# Patient Record
Sex: Female | Born: 2000 | Race: Black or African American | Hispanic: No | Marital: Single | Smoking: Never smoker
Health system: Southern US, Community
[De-identification: ages and names within clinical notes are randomized; demographics above are authoritative.]

## PROBLEM LIST (undated history)

## (undated) DIAGNOSIS — E119 Type 2 diabetes mellitus without complications: Secondary | ICD-10-CM

## (undated) HISTORY — PX: NO PAST SURGERIES: SHX2092

---
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
## 2019-01-19 DIAGNOSIS — E1169 Type 2 diabetes mellitus with other specified complication: Secondary | ICD-10-CM | POA: Insufficient documentation

## 2019-01-19 DIAGNOSIS — E119 Type 2 diabetes mellitus without complications: Secondary | ICD-10-CM | POA: Insufficient documentation

## 2021-02-03 ENCOUNTER — Ambulatory Visit (HOSPITAL_COMMUNITY)
Admission: EM | Admit: 2021-02-03 | Discharge: 2021-02-03 | Disposition: A | Discharge status: Home / Self Care | Payer: 59

## 2021-02-03 ENCOUNTER — Other Ambulatory Visit: Payer: Self-pay

## 2021-02-03 ENCOUNTER — Ambulatory Visit (INDEPENDENT_AMBULATORY_CARE_PROVIDER_SITE_OTHER): Payer: 59

## 2021-02-03 ENCOUNTER — Encounter (HOSPITAL_COMMUNITY): Payer: Self-pay | Admitting: Emergency Medicine

## 2021-02-03 DIAGNOSIS — M7918 Myalgia, other site: Secondary | ICD-10-CM | POA: Diagnosis not present

## 2021-02-03 DIAGNOSIS — M25511 Pain in right shoulder: Secondary | ICD-10-CM

## 2021-02-03 DIAGNOSIS — M25512 Pain in left shoulder: Secondary | ICD-10-CM

## 2021-02-03 HISTORY — DX: Type 2 diabetes mellitus without complications: E11.9

## 2021-02-03 MED ORDER — BACLOFEN 10 MG PO TABS: 10.0000 mg | ORAL_TABLET | Freq: Every day | ORAL | 0 refills | Status: AC

## 2021-02-03 MED ORDER — IBUPROFEN 800 MG PO TABS: 800.0000 mg | ORAL_TABLET | Freq: Three times a day (TID) | ORAL | 0 refills | Status: AC | PRN
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
## 2022-03-29 ENCOUNTER — Ambulatory Visit: Payer: Commercial Managed Care - PPO | Admitting: Family

## 2022-06-17 ENCOUNTER — Encounter: Payer: Self-pay | Admitting: Family

## 2022-06-17 ENCOUNTER — Ambulatory Visit: Payer: Commercial Managed Care - PPO | Admitting: Family

## 2022-06-17 VITALS — BP 130/82 | HR 99 | Temp 97.7°F | Ht 66.25 in | Wt 270.0 lb

## 2022-06-17 DIAGNOSIS — Z7984 Long term (current) use of oral hypoglycemic drugs: Secondary | ICD-10-CM

## 2022-06-17 DIAGNOSIS — E119 Type 2 diabetes mellitus without complications: Secondary | ICD-10-CM | POA: Diagnosis not present

## 2022-06-17 DIAGNOSIS — Z1159 Encounter for screening for other viral diseases: Secondary | ICD-10-CM | POA: Diagnosis not present

## 2022-06-17 DIAGNOSIS — R6889 Other general symptoms and signs: Secondary | ICD-10-CM | POA: Diagnosis not present

## 2022-06-17 DIAGNOSIS — Z23 Encounter for immunization: Secondary | ICD-10-CM | POA: Diagnosis not present

## 2022-06-17 DIAGNOSIS — Z114 Encounter for screening for human immunodeficiency virus [HIV]: Secondary | ICD-10-CM

## 2022-06-17 DIAGNOSIS — E785 Hyperlipidemia, unspecified: Secondary | ICD-10-CM

## 2022-06-17 DIAGNOSIS — R7989 Other specified abnormal findings of blood chemistry: Secondary | ICD-10-CM | POA: Diagnosis not present

## 2022-06-17 DIAGNOSIS — E1169 Type 2 diabetes mellitus with other specified complication: Secondary | ICD-10-CM

## 2022-06-17 LAB — BASIC METABOLIC PANEL
BUN: 10 mg/dL (ref 6–23)
CO2: 24 mEq/L (ref 19–32)
Calcium: 9.3 mg/dL (ref 8.4–10.5)
Chloride: 103 mEq/L (ref 96–112)
Creatinine, Ser: 0.61 mg/dL (ref 0.40–1.20)
GFR: 126.89 mL/min (ref >60.00)
Glucose, Bld: 147 mg/dL — ABNORMAL HIGH (ref 70–99)
Potassium: 4.3 mEq/L (ref 3.5–5.1)
Sodium: 137 mEq/L (ref 135–145)

## 2022-06-17 LAB — CBC
HCT: 37 % (ref 36.0–46.0)
Hemoglobin: 11.9 g/dL — ABNORMAL LOW (ref 12.0–15.0)
MCHC: 32 g/dL (ref 30.0–36.0)
MCV: 77.1 fl — ABNORMAL LOW (ref 78.0–100.0)
Platelets: 439 10*3/uL — ABNORMAL HIGH (ref 150.0–400.0)
RBC: 4.8 Mil/uL (ref 3.87–5.11)
RDW: 14.5 % (ref 11.5–15.5)
WBC: 8.6 10*3/uL (ref 4.0–10.5)

## 2022-06-17 LAB — IBC + FERRITIN
Ferritin: 22.8 ng/mL (ref 10.0–291.0)
Iron: 40 ug/dL — ABNORMAL LOW (ref 42–145)
Saturation Ratios: 11.8 % — ABNORMAL LOW (ref 20.0–50.0)
TIBC: 340.2 ug/dL (ref 250.0–450.0)
Transferrin: 243 mg/dL (ref 212.0–360.0)

## 2022-06-17 LAB — MICROALBUMIN / CREATININE URINE RATIO
Creatinine,U: 161.2 mg/dL
Microalb Creat Ratio: 3 mg/g (ref 0.0–30.0)
Microalb, Ur: 4.9 mg/dL — ABNORMAL HIGH (ref 0.0–1.9)

## 2022-06-17 LAB — T4, FREE: Free T4: 0.86 ng/dL (ref 0.60–1.60)

## 2022-06-17 LAB — T3, FREE: T3, Free: 3.8 pg/mL (ref 2.3–4.2)

## 2022-06-17 LAB — TSH: TSH: 6.84 u[IU]/mL — ABNORMAL HIGH (ref 0.35–5.50)

## 2022-06-17 NOTE — Assessment & Plan Note (Signed)
Cold intolerance  Will assess cbc ibc ferritin pending results Also will repeat tsh with thyroid panel pending results

## 2022-06-17 NOTE — Assessment & Plan Note (Signed)
Pt interested in phentermine but will await labs and let me know if she would like to pursue Did offer referral to weight loss clinic, she declines for today may consider in the future.  Offered ideas on diet/exercise as well.

## 2022-06-17 NOTE — Assessment & Plan Note (Signed)
Work on low cholesterol diet and exercise as tolerated 

## 2022-06-17 NOTE — Addendum Note (Signed)
Addended by: Gabriel Rainwater on: 06/17/2022 12:42 PM   Modules accepted: Orders

## 2022-06-17 NOTE — Progress Notes (Signed)
New Patient Office Visit  Subjective:  Patient ID: Colleen Suarez, female    DOB: 2000-11-02  Age: 22 y.o. MRN: [id number]  CC:  Chief Complaint  Patient presents with   Establish Care    HPI Trinell Weidner is here to establish care as a new patient.  Oriented to practice routines and expectations.  Prior provider was: has not been since seeing a pediatrician   Menses: regular, monthly not overly heavy. Uses pads   Pt is with acute concerns.  Wants her iron checked because at times her hands and feet get cold. She does state she is often cold especially when cold around her.   Weight concerns:  Diet: mom cooks and or she cooks at home. Only occasionally will go out to eat. Occasional fried food at home, but tries to bake at times and or use air fryer. Eats a good amount of rice and soups. Soups are more brothy. Bread sometimes. Does eat a good amount of spinach and broccoli. Only eats about once a day.  Breakfast: will drink a smoothie at breakfast Lunch: usually skips Dinner: meals listed above   Exercise: trying to walk more , in the past did a diet program in the gym which seemed to help her but then had a plateau, once school started back again she steered around from the routine due to college keeping her busy. Will try to walk for 30 minutes daily   chronic concerns:  Type 2 DM: average fasting between 120-150. Diagnosis of diabetes more recent, probably around 2021. Does see an endocrinology. Does chest her blood glucose regularly. Last A1c 01/10/22 6.7. has f/u with her every six months.   Hyperlipidemia: last lipid panel, LDL was 111 01/10/22.   Thyroid level was a bit elevated 01/10/22 at 6.962. she denies increased fatigue. Not gaining weight in a short period of time. No thinning hair.  Wt Readings from Last 3 Encounters:  06/17/22 270 lb (122.5 kg)   B12 def: last b12 was 211 12/23 , is not taking daily b12 supplementation      ROS: Negative unless  specifically indicated above in HPI.   Current Outpatient Medications:    Blood Glucose Monitoring Suppl (ONETOUCH VERIO) w/Device KIT, Use to monitor blood sugar, Disp: , Rfl:    Lancets (ONETOUCH DELICA PLUS LANCET33G) MISC, Apply 1 each topically 2 (two) times daily., Disp: , Rfl:    metFORMIN (GLUCOPHAGE-XR) 500 MG 24 hr tablet, Take 1,000 mg by mouth 2 (two) times daily., Disp: , Rfl:    ONETOUCH VERIO test strip, USE TO TEST TWICE DAILY, Disp: , Rfl:  Past Medical History:  Diagnosis Date   Diabetes mellitus without complication (HCC)    Past Surgical History:  Procedure Laterality Date   NO PAST SURGERIES      Objective:   Today's Vitals: BP 130/82   Pulse 99   Temp 97.7 F (36.5 C) (Temporal)   Ht 5' 6.25" (1.683 m)   Wt 270 lb (122.5 kg)   LMP 05/25/2022   SpO2 100%   BMI 43.25 kg/m   Physical Exam Constitutional:      General: She is not in acute distress.    Appearance: Normal appearance. She is obese. She is not ill-appearing.  HENT:     Head: Normocephalic.     Right Ear: Tympanic membrane normal.     Left Ear: Tympanic membrane normal.     Nose: Nose normal.     Mouth/Throat:  Mouth: Mucous membranes are moist.  Eyes:     Extraocular Movements: Extraocular movements intact.     Pupils: Pupils are equal, round, and reactive to light.  Neck:     Thyroid: No thyroid mass, thyromegaly or thyroid tenderness.  Cardiovascular:     Rate and Rhythm: Normal rate and regular rhythm.  Pulmonary:     Effort: Pulmonary effort is normal.     Breath sounds: Normal breath sounds.  Abdominal:     General: Abdomen is flat. Bowel sounds are normal.     Palpations: Abdomen is soft.     Tenderness: There is no guarding or rebound.  Musculoskeletal:        General: Normal range of motion.     Cervical back: Normal range of motion.  Skin:    General: Skin is warm.     Capillary Refill: Capillary refill takes less than 2 seconds.  Neurological:     General: No  focal deficit present.     Mental Status: She is alert.  Psychiatric:        Mood and Affect: Mood normal.        Behavior: Behavior normal.        Thought Content: Thought content normal.        Judgment: Judgment normal.     Assessment & Plan:  Abnormal TSH Assessment & Plan: Cold intolerance  Will assess cbc ibc ferritin pending results Also will repeat tsh with thyroid panel pending results   Orders: -     Thyroid Panel With TSH  Cold intolerance -     IBC + Ferritin -     CBC  Screening for HIV (human immunodeficiency virus) -     HIV Antibody (routine testing w rflx)  Type 2 diabetes mellitus without complication, without long-term current use of insulin (HCC) Assessment & Plan: Continue metfromin as prescribed f/u with endo as scheduled Did d/w pt that she could consider GLP 1 for weight loss and could d/w with her endo however she declines at this time.  Foot exam completed last visit with endo per pt.  Will obtain urine M/A today  Orders: -     Microalbumin / creatinine urine ratio -     Basic metabolic panel  Encounter for hepatitis C screening test for low risk patient -     Hepatitis C antibody  Morbid obesity (HCC) Assessment & Plan: Pt interested in phentermine but will await labs and let me know if she would like to pursue Did offer referral to weight loss clinic, she declines for today may consider in the future.  Offered ideas on diet/exercise as well.     Hyperlipidemia associated with type 2 diabetes mellitus (HCC) Assessment & Plan: Work on low cholesterol diet and exercise as tolerated      Follow-up: Return in about 1 year (around 06/17/2023) for f/u CPE.   Mort Sawyers, FNP

## 2022-06-17 NOTE — Assessment & Plan Note (Signed)
Continue metfromin as prescribed f/u with endo as scheduled Did d/w pt that she could consider GLP 1 for weight loss and could d/w with her endo however she declines at this time.  Foot exam completed last visit with endo per pt.  Will obtain urine M/A today

## 2022-06-18 ENCOUNTER — Encounter: Payer: Self-pay | Admitting: Family

## 2022-06-18 ENCOUNTER — Other Ambulatory Visit: Payer: Self-pay | Admitting: Family

## 2022-06-18 DIAGNOSIS — E039 Hypothyroidism, unspecified: Secondary | ICD-10-CM

## 2022-06-18 DIAGNOSIS — R809 Proteinuria, unspecified: Secondary | ICD-10-CM

## 2022-06-18 LAB — HIV ANTIBODY (ROUTINE TESTING W REFLEX): HIV 1&2 Ab, 4th Generation: NONREACTIVE

## 2022-06-18 LAB — HEPATITIS C ANTIBODY: Hepatitis C Ab: NONREACTIVE

## 2022-06-18 MED ORDER — LOSARTAN POTASSIUM 25 MG PO TABS: 25.0000 mg | ORAL_TABLET | Freq: Every day | ORAL | 3 refills | Status: DC

## 2022-06-18 MED ORDER — LEVOTHYROXINE SODIUM 50 MCG PO TABS: 50.0000 ug | ORAL_TABLET | Freq: Every day | ORAL | 3 refills | Status: DC

## 2022-06-20 ENCOUNTER — Encounter: Payer: Self-pay | Admitting: Family

## 2022-06-20 NOTE — Telephone Encounter (Signed)
Please see the MyChart message reply(ies) for my assessment and plan.  The patient gave consent for this Medical Advice Message and is aware that it may result in a bill to their insurance company as well as the possibility that this may result in a co-payment or deductible. They are an established patient, but are not seeking medical advice exclusively about a problem treated during an in person or video visit in the last 7 days. I did not recommend an in person or video visit within 7 days of my reply.  I spent a total of 7 minutes cumulative time within 7 days through [employer] Mort Sawyers, FNP

## 2022-06-20 NOTE — Telephone Encounter (Signed)

## 2022-06-20 NOTE — Telephone Encounter (Signed)
Not a problem! I will respond to this once I am done seeing patients. I always appreciate someone reaching out to ask questions it doesn't help if you don't ask :)

## 2022-06-21 NOTE — Telephone Encounter (Signed)
Please see the MyChart message reply(ies) for my assessment and plan.  The patient gave consent for this Medical Advice Message and is aware that it may result in a bill to their insurance company as well as the possibility that this may result in a co-payment or deductible. They are an established patient, but are not seeking medical advice exclusively about a problem treated during an in person or video visit in the last 7 days. I did not recommend an in person or video visit within 7 days of my reply.  I spent a total of 10 minutes cumulative time within 7 days through [employer] Mort Sawyers, FNP

## 2022-07-29 ENCOUNTER — Other Ambulatory Visit (INDEPENDENT_AMBULATORY_CARE_PROVIDER_SITE_OTHER): Payer: Commercial Managed Care - PPO

## 2022-07-29 DIAGNOSIS — E039 Hypothyroidism, unspecified: Secondary | ICD-10-CM | POA: Diagnosis not present

## 2022-07-30 LAB — TSH: TSH: 2.8 u[IU]/mL (ref 0.35–5.50)

## 2022-07-31 ENCOUNTER — Encounter: Payer: Self-pay | Admitting: Family

## 2022-08-05 ENCOUNTER — Ambulatory Visit: Payer: Commercial Managed Care - PPO | Admitting: Family

## 2023-02-24 ENCOUNTER — Ambulatory Visit: Payer: Commercial Managed Care - PPO | Admitting: Family

## 2023-03-03 ENCOUNTER — Encounter: Payer: Commercial Managed Care - PPO | Admitting: Family

## 2023-03-04 ENCOUNTER — Ambulatory Visit (INDEPENDENT_AMBULATORY_CARE_PROVIDER_SITE_OTHER): Payer: Commercial Managed Care - PPO | Admitting: Family

## 2023-03-04 ENCOUNTER — Encounter: Payer: Self-pay | Admitting: Family

## 2023-03-04 VITALS — BP 126/84 | HR 115 | Temp 97.8°F | Ht 66.5 in | Wt 262.0 lb

## 2023-03-04 DIAGNOSIS — E119 Type 2 diabetes mellitus without complications: Secondary | ICD-10-CM

## 2023-03-04 DIAGNOSIS — E039 Hypothyroidism, unspecified: Secondary | ICD-10-CM | POA: Diagnosis not present

## 2023-03-04 DIAGNOSIS — E785 Hyperlipidemia, unspecified: Secondary | ICD-10-CM

## 2023-03-04 DIAGNOSIS — E1169 Type 2 diabetes mellitus with other specified complication: Secondary | ICD-10-CM | POA: Diagnosis not present

## 2023-03-04 DIAGNOSIS — E049 Nontoxic goiter, unspecified: Secondary | ICD-10-CM

## 2023-03-04 DIAGNOSIS — R7989 Other specified abnormal findings of blood chemistry: Secondary | ICD-10-CM

## 2023-03-04 DIAGNOSIS — Z23 Encounter for immunization: Secondary | ICD-10-CM

## 2023-03-04 DIAGNOSIS — Z862 Personal history of diseases of the blood and blood-forming organs and certain disorders involving the immune mechanism: Secondary | ICD-10-CM

## 2023-03-04 DIAGNOSIS — Z7984 Long term (current) use of oral hypoglycemic drugs: Secondary | ICD-10-CM

## 2023-03-04 DIAGNOSIS — F411 Generalized anxiety disorder: Secondary | ICD-10-CM

## 2023-03-04 LAB — IBC + FERRITIN
Ferritin: 32.9 ng/mL (ref 10.0–291.0)
Iron: 69 ug/dL (ref 42–145)
Saturation Ratios: 22.1 % (ref 20.0–50.0)
TIBC: 312.2 ug/dL (ref 250.0–450.0)
Transferrin: 223 mg/dL (ref 212.0–360.0)

## 2023-03-04 LAB — CBC
HCT: 39 % (ref 36.0–46.0)
Hemoglobin: 12.6 g/dL (ref 12.0–15.0)
MCHC: 32.4 g/dL (ref 30.0–36.0)
MCV: 78.4 fL (ref 78.0–100.0)
Platelets: 445 10*3/uL — ABNORMAL HIGH (ref 150.0–400.0)
RBC: 4.97 Mil/uL (ref 3.87–5.11)
RDW: 14.9 % (ref 11.5–15.5)
WBC: 6.9 10*3/uL (ref 4.0–10.5)

## 2023-03-04 LAB — BASIC METABOLIC PANEL
BUN: 6 mg/dL (ref 6–23)
CO2: 26 meq/L (ref 19–32)
Calcium: 9.4 mg/dL (ref 8.4–10.5)
Chloride: 104 meq/L (ref 96–112)
Creatinine, Ser: 0.6 mg/dL (ref 0.40–1.20)
GFR: 126.76 mL/min (ref >60.00)
Glucose, Bld: 122 mg/dL — ABNORMAL HIGH (ref 70–99)
Potassium: 4.2 meq/L (ref 3.5–5.1)
Sodium: 140 meq/L (ref 135–145)

## 2023-03-04 LAB — LIPID PANEL
Cholesterol: 162 mg/dL (ref 0–200)
HDL: 35.9 mg/dL — ABNORMAL LOW (ref >39.00)
LDL Cholesterol: 106 mg/dL — ABNORMAL HIGH (ref 0–99)
NonHDL: 126.31
Total CHOL/HDL Ratio: 5
Triglycerides: 102 mg/dL (ref 0.0–149.0)
VLDL: 20.4 mg/dL (ref 0.0–40.0)

## 2023-03-04 LAB — TSH: TSH: 2.98 u[IU]/mL (ref 0.35–5.50)

## 2023-03-04 MED ORDER — HYDROXYZINE PAMOATE 25 MG PO CAPS: 25.0000 mg | ORAL_CAPSULE | Freq: Three times a day (TID) | ORAL | 0 refills | Status: AC | PRN

## 2023-03-04 NOTE — Assessment & Plan Note (Signed)
Cont f/u with endo as scheduled  Cont metformin 1000 mg twice daily.

## 2023-03-04 NOTE — Assessment & Plan Note (Signed)
Tsh repeat today  U/s thyroid pt to schedule

## 2023-03-04 NOTE — Progress Notes (Signed)
Subjective:  Patient ID: Colleen Suarez, female    DOB: 02-28-2000  Age: 23 y.o. MRN: [id number]  Patient Care Team: Mort Sawyers, FNP as PCP - General (Family Medicine)   CC:  Chief Complaint  Patient presents with   Annual Exam    HPI Colleen Suarez is a 23 y.o. female who presents today for an annual physical exam. She reports consuming a general diet. The patient does not participate in regular exercise at present. She generally feels fairly well. She reports sleeping fairly well. She does have additional problems to discuss today.   Vision:Within last year Dental:Receives regular dental care  Last pap:   Pt is without acute concerns.   Dr. Karrie Doffing is prescribing phentermine, currently on 37.5 mg she is seeing endocrinologist for obesity.   Notices a spot on the right side of her throat, midline to lateral right side. Not tender. Noticed it about one month ago. Has not increased in size. No trouble breathing or swallowing.   Diabetes type 2: on metformin 1000 mg twice daily.   Hypothyroid: doing well on 50 mcg levothyroxine. Does take mvi and iron separate four hours from med. No increased fatigue but at times slightly fatigued but takes a nap and feels energized.   Wt Readings from Last 3 Encounters:  03/04/23 262 lb (118.8 kg)  06/17/22 270 lb (122.5 kg)   C/o anxiety state at times, worse at night time. She has racing thoughts at times hard to turn brain off to go to sleep. Not daily. She does work on [employer].    Advanced Directives Patient does not have advanced directives   DEPRESSION SCREENING    06/17/2022   12:42 PM  PHQ 2/9 Scores  PHQ - 2 Score 1     ROS: Negative unless specifically indicated above in HPI.    Current Outpatient Medications:    Blood Glucose Monitoring Suppl (ONETOUCH VERIO) w/Device KIT, Use to monitor blood sugar, Disp: , Rfl:    hydrOXYzine (VISTARIL) 25 MG capsule, Take 1 capsule (25 mg total) by mouth every 8 (eight) hours  as needed., Disp: 30 capsule, Rfl: 0   Lancets (ONETOUCH DELICA PLUS LANCET33G) MISC, Apply 1 each topically 2 (two) times daily., Disp: , Rfl:    levothyroxine (SYNTHROID) 50 MCG tablet, Take 1 tablet (50 mcg total) by mouth daily., Disp: 90 tablet, Rfl: 3   losartan (COZAAR) 25 MG tablet, Take 1 tablet (25 mg total) by mouth daily., Disp: 90 tablet, Rfl: 3   metFORMIN (GLUCOPHAGE-XR) 500 MG 24 hr tablet, Take 1,000 mg by mouth 2 (two) times daily., Disp: , Rfl:    ONETOUCH VERIO test strip, USE TO TEST TWICE DAILY, Disp: , Rfl:    phentermine 37.5 MG capsule, Take 37.5 mg by mouth every morning., Disp: , Rfl:     Objective:    BP 126/84 (BP Location: Left Arm, Patient Position: Sitting, Cuff Size: Large)   Pulse (!) 115   Temp 97.8 F (36.6 C) (Temporal)   Ht 5' 6.5" (1.689 m)   Wt 262 lb (118.8 kg)   LMP 03/02/2023 (Exact Date)   SpO2 98%   BMI 41.65 kg/m   BP Readings from Last 3 Encounters:  03/04/23 126/84  06/17/22 130/82  02/03/21 (!) 133/91      Physical Exam Vitals reviewed.  Constitutional:      General: She is not in acute distress.    Appearance: Normal appearance. She is obese. She is not ill-appearing, toxic-appearing or  diaphoretic.  HENT:     Head: Normocephalic.  Neck:     Thyroid: Thyromegaly (right sided) present. No thyroid mass or thyroid tenderness.  Cardiovascular:     Rate and Rhythm: Normal rate and regular rhythm.  Pulmonary:     Effort: Pulmonary effort is normal.     Breath sounds: Normal breath sounds.  Musculoskeletal:        General: Normal range of motion.  Neurological:     General: No focal deficit present.     Mental Status: She is alert and oriented to person, place, and time. Mental status is at baseline.  Psychiatric:        Mood and Affect: Mood normal.        Behavior: Behavior normal.        Thought Content: Thought content normal.        Judgment: Judgment normal.          Assessment & Plan:  Type 2 diabetes  mellitus without complication, without long-term current use of insulin (HCC) Assessment & Plan: Cont f/u with endo as scheduled  Cont metformin 1000 mg twice daily.   Orders: -     Microalbumin / creatinine urine ratio; Future -     Basic metabolic panel  Morbid obesity (HCC) Assessment & Plan: Pt advised to work on diet and exercise as tolerated    Hyperlipidemia associated with type 2 diabetes mellitus (HCC) Assessment & Plan: Ordered lipid panel, pending results. Work on low cholesterol diet and exercise as tolerated   Orders: -     Lipid panel  History of anemia -     CBC -     IBC + Ferritin; Future  Acquired hypothyroidism -     TSH -     US THYROID; Future  Goiter -     US THYROID; Future  Anxiety state -     hydrOXYzine Pamoate; Take 1 capsule (25 mg total) by mouth every 8 (eight) hours as needed.  Dispense: 30 capsule; Refill: 0  Abnormal TSH Assessment & Plan: Tsh repeat today  U/s thyroid pt to schedule        Follow-up: Return in about 6 months (around 09/01/2023) for f/u hypothyroid.   Mort Sawyers, FNP

## 2023-03-04 NOTE — Patient Instructions (Signed)
Thyroid ultrasound imaging Has been ordered at the following location.  Please call to schedule a time and date that would work for you.    [address], [place] Phone [phone],  8-430 pm    ------------------------------------

## 2023-03-04 NOTE — Assessment & Plan Note (Signed)
 Ordered lipid panel, pending results. Work on low cholesterol diet and exercise as tolerated

## 2023-03-04 NOTE — Assessment & Plan Note (Signed)
 Pt advised to work on diet and exercise as tolerated

## 2023-03-10 ENCOUNTER — Other Ambulatory Visit: Payer: Commercial Managed Care - PPO

## 2023-03-10 ENCOUNTER — Other Ambulatory Visit (INDEPENDENT_AMBULATORY_CARE_PROVIDER_SITE_OTHER): Payer: Commercial Managed Care - PPO

## 2023-03-10 DIAGNOSIS — E119 Type 2 diabetes mellitus without complications: Secondary | ICD-10-CM | POA: Diagnosis not present

## 2023-03-11 ENCOUNTER — Encounter: Payer: Self-pay | Admitting: Family

## 2023-03-11 ENCOUNTER — Ambulatory Visit
Admission: RE | Admit: 2023-03-11 | Discharge: 2023-03-11 | Disposition: A | Discharge status: Home / Self Care | Payer: Commercial Managed Care - PPO | Source: Ambulatory Visit | Attending: Family

## 2023-03-11 DIAGNOSIS — E039 Hypothyroidism, unspecified: Secondary | ICD-10-CM

## 2023-03-11 DIAGNOSIS — E049 Nontoxic goiter, unspecified: Secondary | ICD-10-CM

## 2023-03-11 LAB — MICROALBUMIN / CREATININE URINE RATIO
Creatinine,U: 26.4 mg/dL
Microalb Creat Ratio: 26.5 mg/g (ref 0.0–30.0)
Microalb, Ur: <0.7 mg/dL (ref 0.0–1.9)

## 2023-03-13 ENCOUNTER — Other Ambulatory Visit: Payer: Self-pay | Admitting: Family

## 2023-03-13 ENCOUNTER — Encounter: Payer: Self-pay | Admitting: Family

## 2023-03-13 DIAGNOSIS — R590 Localized enlarged lymph nodes: Secondary | ICD-10-CM | POA: Insufficient documentation

## 2023-03-13 DIAGNOSIS — E041 Nontoxic single thyroid nodule: Secondary | ICD-10-CM | POA: Insufficient documentation

## 2023-03-14 ENCOUNTER — Other Ambulatory Visit (INDEPENDENT_AMBULATORY_CARE_PROVIDER_SITE_OTHER): Payer: Commercial Managed Care - PPO

## 2023-03-14 DIAGNOSIS — E041 Nontoxic single thyroid nodule: Secondary | ICD-10-CM | POA: Diagnosis not present

## 2023-03-17 ENCOUNTER — Encounter: Payer: Self-pay | Admitting: Family

## 2023-03-17 DIAGNOSIS — E119 Type 2 diabetes mellitus without complications: Secondary | ICD-10-CM

## 2023-03-17 LAB — THYROID PEROXIDASE ANTIBODIES (TPO) (REFL): Thyroperoxidase Ab SerPl-aCnc: 1 [IU]/mL (ref <9)

## 2023-03-26 ENCOUNTER — Other Ambulatory Visit: Payer: Self-pay | Admitting: Family

## 2023-03-26 ENCOUNTER — Encounter: Payer: Self-pay | Admitting: Family

## 2023-03-26 DIAGNOSIS — E119 Type 2 diabetes mellitus without complications: Secondary | ICD-10-CM

## 2023-03-26 NOTE — Addendum Note (Signed)
 Addended by: Alvina Chou on: 03/26/2023 09:25 AM   Modules accepted: Orders

## 2023-03-28 ENCOUNTER — Other Ambulatory Visit

## 2023-03-28 ENCOUNTER — Telehealth: Payer: Self-pay | Admitting: Family

## 2023-03-28 DIAGNOSIS — E119 Type 2 diabetes mellitus without complications: Secondary | ICD-10-CM | POA: Diagnosis not present

## 2023-03-28 LAB — MICROALBUMIN / CREATININE URINE RATIO
Creatinine,U: 158.1 mg/dL
Microalb Creat Ratio: 17.1 mg/g (ref 0.0–30.0)
Microalb, Ur: 2.7 mg/dL — ABNORMAL HIGH (ref 0.0–1.9)

## 2023-03-28 NOTE — Telephone Encounter (Signed)
 Called lvm for patient to call back at [phone]. Patient needs to come before 2 for repeat urine at no charge or come back one day next week Mon-Thurs.

## 2023-03-31 ENCOUNTER — Encounter: Payer: Self-pay | Admitting: Family

## 2023-04-17 MED ORDER — TIRZEPATIDE 2.5 MG/0.5ML ~~LOC~~ SOAJ
2.5000 mg | SUBCUTANEOUS | 0 refills | Status: DC
Start: 2023-04-17 — End: 2023-05-13

## 2023-04-17 NOTE — Addendum Note (Signed)
 Addended by: Mort Sawyers on: 04/17/2023 05:01 PM   Modules accepted: Orders

## 2023-04-23 ENCOUNTER — Other Ambulatory Visit (HOSPITAL_BASED_OUTPATIENT_CLINIC_OR_DEPARTMENT_OTHER): Payer: Self-pay

## 2023-04-23 ENCOUNTER — Telehealth: Payer: Self-pay | Admitting: Pharmacy Technician

## 2023-04-23 NOTE — Telephone Encounter (Signed)
 PA request is not needed at this time. New Encounter has been or will be created for follow up. For additional info see Pharmacy Prior Auth telephone encounter from 04/23/23.

## 2023-04-23 NOTE — Telephone Encounter (Signed)
 Pharmacy Patient Advocate Encounter   Received notification from Patient Advice Request messages that prior authorization for [facility] 2.5 MG is required/requested.   Insurance verification completed.   The patient is insured through [facility] .   Per test claim: Refill too soon. PA is not needed at this time. Medication was filled 04/21/23. Next eligible fill date is 05/12/23.

## 2023-05-13 ENCOUNTER — Other Ambulatory Visit: Payer: Self-pay | Admitting: Family

## 2023-05-13 DIAGNOSIS — E119 Type 2 diabetes mellitus without complications: Secondary | ICD-10-CM

## 2023-05-14 ENCOUNTER — Encounter: Payer: Self-pay | Admitting: Family

## 2023-05-14 DIAGNOSIS — E119 Type 2 diabetes mellitus without complications: Secondary | ICD-10-CM

## 2023-05-15 ENCOUNTER — Other Ambulatory Visit: Payer: Self-pay | Admitting: Family

## 2023-05-15 DIAGNOSIS — E119 Type 2 diabetes mellitus without complications: Secondary | ICD-10-CM

## 2023-05-15 MED ORDER — TIRZEPATIDE 5 MG/0.5ML ~~LOC~~ SOAJ: 5.0000 mg | SUBCUTANEOUS | 0 refills | Status: DC

## 2023-05-22 ENCOUNTER — Telehealth: Payer: Self-pay | Admitting: *Deleted

## 2023-05-22 NOTE — Telephone Encounter (Signed)
 Confirmed appt for 5/12 - included address and time - [facility] 05/22/23 (patient unable to write down new address)

## 2023-05-26 ENCOUNTER — Encounter (INDEPENDENT_AMBULATORY_CARE_PROVIDER_SITE_OTHER): Payer: Self-pay | Admitting: Otolaryngology

## 2023-05-26 ENCOUNTER — Ambulatory Visit (INDEPENDENT_AMBULATORY_CARE_PROVIDER_SITE_OTHER): Admitting: Otolaryngology

## 2023-05-26 VITALS — BP 137/97 | HR 89 | Ht 66.0 in | Wt 265.0 lb

## 2023-05-26 DIAGNOSIS — R590 Localized enlarged lymph nodes: Secondary | ICD-10-CM

## 2023-05-26 NOTE — Progress Notes (Signed)
 ENT CONSULT:  Reason for Consult: submandibular lymph node   HPI: Discussed the use of AI scribe software for clinical note transcription with the patient, who gave verbal consent to proceed.  History of Present Illness Colleen Suarez is a 23 year old female who presents with a raised area on the right side of her neck. She was referred by her primary care physician for evaluation of a raised area on the right side of her neck.  She noticed a raised area on the right side of her neck, which was identified as a 2.4 cm lymph node on ultrasound. The lymph node was considered reactive. There is no pain in the area, and she was unaware of its presence until the ultrasound.  No difficulty swallowing, lumps elsewhere in the body, neck pain, or neck strain issues. No fevers, chills, weight loss, or night sweats. No history of thyroid  nodules, and thyroid  function tests have been normal.  She is currently taking levothyroxine  50 mcg daily, prescribed after initially high thyroid  levels, which have since normalized. She perceives a difference in muscle size on the right side of her neck, attributed to the raised area.    Past Medical History:  Diagnosis Date   Diabetes mellitus without complication (HCC)     Past Surgical History:  Procedure Laterality Date   NO PAST SURGERIES      Family History  Problem Relation Age of Onset   Healthy Mother    Diabetes Father    Hypertension Father    Healthy Brother     Social History:  reports that she has never smoked. She has never used smokeless tobacco. She reports current alcohol use. She reports that she does not use drugs.  Allergies: No Known Allergies  Medications: I have reviewed the patient's current medications.  The PMH, PSH, Medications, Allergies, and SH were reviewed and updated.  ROS: Constitutional: Negative for fever, weight loss and weight gain. Cardiovascular: Negative for chest pain and dyspnea on exertion. Respiratory: Is  not experiencing shortness of breath at rest. Gastrointestinal: Negative for nausea and vomiting. Neurological: Negative for headaches. Psychiatric: The patient is not nervous/anxious  Blood pressure (!) 137/97, pulse 89, height 5\' 6"  (1.676 m), weight 265 lb (120.2 kg), SpO2 97%. Body mass index is 42.77 kg/m.  PHYSICAL EXAM:  Exam: General: Well-developed, well-nourished Respiratory Respiratory effort: Equal inspiration and expiration without stridor Cardiovascular Peripheral Vascular: Warm extremities with equal color/perfusion Eyes: No nystagmus with equal extraocular motion bilaterally Neuro/Psych/Balance: Patient oriented to person, place, and time; Appropriate mood and affect; Gait is intact with no imbalance; Cranial nerves I-XII are intact Head and Face Inspection: Normocephalic and atraumatic without mass or lesion Palpation: Facial skeleton intact without bony stepoffs Salivary Glands: No mass or tenderness Facial Strength: Facial motility symmetric and full bilaterally ENT Pinna: External ear intact and fully developed External canal: Canal is patent with intact skin Tympanic Membrane: Clear and mobile External Nose: No scar or anatomic deformity Internal Nose: Septum is relatively straight on anterior rhinoscopy.   Lips, Teeth, and gums: Mucosa and teeth intact and viable TMJ: No pain to palpation with full mobility Oral cavity/oropharynx: No erythema or exudate, no lesions present 1+ tonsils b/l symmetric  Neck Neck and Trachea: Midline trachea without mass or lesion Thyroid : No mass or nodularity Lymphatics: No lymphadenopathy   Studies Reviewed: Thyroid  U/S 03/11/23 IMPRESSION: 1. Moderate thyroid  heterogeneity without significant nodule or mass. 2. Mildly prominent right submandibular lymph node, favored to be reactive.  TSH 2.98 (  normal) - previously elevated, currently on Synthroid    Assessment/Plan: Encounter Diagnosis  Name Primary?   Cervical  lymphadenopathy Yes    Assessment & Plan Cervical lymphadenopathy  2.4 cm reactive lymph node on recent thyroid  U/S done on 03/11/23 likely reactive, denies dysphagia/odynophagia, other neck masses, masses anywhere else. No fevers, chills, night sweats, unintentional weight loss. Likely benign. Will obtain repeat neck U/S to evaluate for size changes in 6 months from now.  I did not palpate any cervical lymphadenopathy on exam today and submandibular lymph node seen on neck U/S back in Feb 2025, could have resolved since then.  - Ordered follow-up neck ultrasound in six months to monitor lymph node size and characteristics. - Advised monitoring for new symptoms such as additional lymph nodes, lumps, fevers, chills, weight loss, or night sweats, and return if she experiences new sx - RTC 6 mo after repeat neck U/S - will consider flexible scope exam when she returns if needed.    Thank you for allowing me to participate in the care of this patient. Please do not hesitate to contact me with any questions or concerns.   Artice Last, MD Otolaryngology [facility] Phone: [phone] Fax: [phone]    05/26/2023, 1:23 PM
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
## 2023-06-11 ENCOUNTER — Other Ambulatory Visit: Payer: Self-pay | Admitting: Family

## 2023-06-11 DIAGNOSIS — E039 Hypothyroidism, unspecified: Secondary | ICD-10-CM

## 2023-06-11 DIAGNOSIS — R809 Proteinuria, unspecified: Secondary | ICD-10-CM

## 2023-06-18 MED ORDER — TIRZEPATIDE 7.5 MG/0.5ML ~~LOC~~ SOAJ: 7.5000 mg | SUBCUTANEOUS | 1 refills | Status: DC

## 2023-06-18 NOTE — Addendum Note (Signed)
 Addended by: Felicita Horns on: 06/18/2023 03:09 PM   Modules accepted: Orders

## 2023-11-11 ENCOUNTER — Ambulatory Visit: Admitting: Family

## 2023-11-11 VITALS — BP 118/62 | HR 93 | Temp 98.0°F | Ht 66.0 in

## 2023-11-11 DIAGNOSIS — R809 Proteinuria, unspecified: Secondary | ICD-10-CM | POA: Diagnosis not present

## 2023-11-11 DIAGNOSIS — E039 Hypothyroidism, unspecified: Secondary | ICD-10-CM | POA: Diagnosis not present

## 2023-11-11 DIAGNOSIS — Z23 Encounter for immunization: Secondary | ICD-10-CM | POA: Diagnosis not present

## 2023-11-11 DIAGNOSIS — Z862 Personal history of diseases of the blood and blood-forming organs and certain disorders involving the immune mechanism: Secondary | ICD-10-CM | POA: Diagnosis not present

## 2023-11-11 DIAGNOSIS — Z7985 Long-term (current) use of injectable non-insulin antidiabetic drugs: Secondary | ICD-10-CM

## 2023-11-11 DIAGNOSIS — E119 Type 2 diabetes mellitus without complications: Secondary | ICD-10-CM | POA: Diagnosis not present

## 2023-11-11 DIAGNOSIS — Z6841 Body Mass Index (BMI) 40.0 and over, adult: Secondary | ICD-10-CM

## 2023-11-11 MED ORDER — TIRZEPATIDE 10 MG/0.5ML ~~LOC~~ SOAJ: 10.0000 mg | SUBCUTANEOUS | 0 refills | Status: DC

## 2023-11-11 NOTE — Progress Notes (Signed)
 Established Patient Office Visit  Subjective:      CC:  Chief Complaint  Patient presents with   Medical Management of Chronic Issues    HPI: Colleen Suarez is a 23 y.o. female presenting on 11/11/2023 for Medical Management of Chronic Issues .  Discussed the use of AI scribe software for clinical note transcription with the patient, who gave verbal consent to proceed.  History of Present Illness Colleen Suarez is a 23 year old female with diabetes and hypothyroidism who presents for medication management and follow-up.  She is currently taking levothyroxine , ensuring it is separated from food and other medications by at least 30 minutes. She is not taking any medications for acid reflux and has stopped taking biotin. She is also on losartan  25 mg. She has stopped taking metformin after consulting with her endocrinologist, who is managing her diabetes.  She is currently on Mounjaro  for diabetes management and has been experiencing weight loss. She recently completed a dose of 7.5 mg of Mounjaro . Her A1c was last recorded at 7.9% in April 2025. No nausea, vomiting, abdominal pain, or constipation with Mounjaro  use.  Her hands and feet are often cold, sometimes feeling 'icy cold,' and she wonders if this is related to her anemia. She continues to take iron supplements. No joint pain.  She has received her flu shot today and is up to date with her eye exams, having had one in April of this year.    Wt Readings from Last 3 Encounters:  05/26/23 265 lb (120.2 kg)  03/04/23 262 lb (118.8 kg)  06/17/22 270 lb (122.5 kg)         Social history:  Relevant past medical, surgical, family and social history reviewed and updated as indicated. Interim medical history since our last visit reviewed.  Allergies and medications reviewed and updated.  DATA REVIEWED: CHART IN EPIC     ROS: Negative unless specifically indicated above in HPI.    Current Outpatient Medications:     Blood Glucose Monitoring Suppl (ONETOUCH VERIO) w/Device KIT, Use to monitor blood sugar, Disp: , Rfl:    hydrOXYzine  (VISTARIL ) 25 MG capsule, Take 1 capsule (25 mg total) by mouth every 8 (eight) hours as needed., Disp: 30 capsule, Rfl: 0   Lancets (ONETOUCH DELICA PLUS LANCET33G) MISC, Apply 1 each topically 2 (two) times daily., Disp: , Rfl:    levothyroxine  (SYNTHROID ) 50 MCG tablet, TAKE 1 TABLET BY MOUTH EVERY DAY, Disp: 90 tablet, Rfl: 1   losartan  (COZAAR ) 25 MG tablet, TAKE 1 TABLET (25 MG TOTAL) BY MOUTH DAILY., Disp: 90 tablet, Rfl: 1   metFORMIN (GLUCOPHAGE-XR) 500 MG 24 hr tablet, Take 1,000 mg by mouth 2 (two) times daily., Disp: , Rfl:    ONETOUCH VERIO test strip, USE TO TEST TWICE DAILY, Disp: , Rfl:    tirzepatide  (MOUNJARO ) 10 MG/0.5ML Pen, Inject 10 mg into the skin once a week., Disp: 2 mL, Rfl: 0        Objective:        BP 118/62   Pulse 93   Temp 98 F (36.7 C) (Oral)   Ht 5' 6 (1.676 m)   SpO2 99%   BMI 42.77 kg/m   Physical Exam CARDIOVASCULAR: Heart normal  Wt Readings from Last 3 Encounters:  05/26/23 265 lb (120.2 kg)  03/04/23 262 lb (118.8 kg)  06/17/22 270 lb (122.5 kg)    Physical Exam Vitals reviewed.  Constitutional:      General: She is not  in acute distress.    Appearance: Normal appearance. She is normal weight. She is not ill-appearing, toxic-appearing or diaphoretic.  HENT:     Head: Normocephalic.  Cardiovascular:     Rate and Rhythm: Normal rate.  Pulmonary:     Effort: Pulmonary effort is normal.  Musculoskeletal:        General: Normal range of motion.  Neurological:     General: No focal deficit present.     Mental Status: She is alert and oriented to person, place, and time. Mental status is at baseline.  Psychiatric:        Mood and Affect: Mood normal.        Behavior: Behavior normal.        Thought Content: Thought content normal.        Judgment: Judgment normal.          Results LABS   A1c: 7.9  (10/04/2023)   Urine microalbumin: Slightly elevated (03/2023)  Assessment & Plan:   Assessment and Plan Assessment & Plan Type 2 diabetes mellitus Well-controlled with an A1c of 7.9%. She has discontinued metformin and is on Mounjaro , experiencing weight loss. The endocrinologist manages her diabetes, and there is a discussion about transitioning Mounjaro  management to the endocrinologist. - Increased Mounjaro  to 10 mg. - Continue follow-up with endocrinologist for diabetes management.  Morbid obesity She is experiencing weight loss with Mounjaro , currently at 233 lbs, indicating progress in weight management. - Continue Mounjaro  for weight management.  Acquired hypothyroidism She is asymptomatic and compliant with levothyroxine , taking it separately from food and other medications as advised. - Will repeat thyroid  levels.  Chronic kidney disease Managed with losartan  to protect kidney function. She is concerned about discontinuing losartan  due to previous metformin use, but losartan  is primarily for kidney protection due to diabetes. - Continue losartan  for kidney protection. - Will repeat urine microalbumin test when convenient.  Iron deficiency anemia She reports cold hands and feet, possibly related to Raynaud's phenomenon rather than anemia. She continues iron supplementation. - Checked iron levels.  Raynaud's phenomenon (suspected) She experiences cold hands and feet, suspected to be Raynaud's phenomenon, especially in cold weather. No joint pain is reported. - Monitor symptoms and consider wearing mittens in cold weather.  Encounter for immunization She received her flu shot today.  Recording duration: 10 minutes      Return in about 6 months (around 05/11/2024) for f/u CPE.     Ginger Patrick, MSN, APRN, FNP-C [facility]

## 2023-11-12 LAB — CBC
HCT: 37.8 % (ref 36.0–46.0)
Hemoglobin: 12.4 g/dL (ref 12.0–15.0)
MCHC: 32.9 g/dL (ref 30.0–36.0)
MCV: 78.4 fl (ref 78.0–100.0)
Platelets: 454 K/uL — ABNORMAL HIGH (ref 150.0–400.0)
RBC: 4.82 Mil/uL (ref 3.87–5.11)
RDW: 14.5 % (ref 11.5–15.5)
WBC: 6 K/uL (ref 4.0–10.5)

## 2023-11-12 LAB — IBC + FERRITIN
Ferritin: 24.8 ng/mL (ref 10.0–291.0)
Iron: 32 ug/dL — ABNORMAL LOW (ref 42–145)
Saturation Ratios: 8.9 % — ABNORMAL LOW (ref 20.0–50.0)
TIBC: 359.8 ug/dL (ref 250.0–450.0)
Transferrin: 257 mg/dL (ref 212.0–360.0)

## 2023-11-12 LAB — TSH: TSH: 1.31 u[IU]/mL (ref 0.35–5.50)

## 2023-11-12 LAB — T4, FREE: Free T4: 0.94 ng/dL (ref 0.60–1.60)

## 2023-11-13 ENCOUNTER — Ambulatory Visit: Payer: Self-pay | Admitting: Family

## 2023-12-15 ENCOUNTER — Other Ambulatory Visit: Payer: Self-pay | Admitting: Family

## 2023-12-15 DIAGNOSIS — E039 Hypothyroidism, unspecified: Secondary | ICD-10-CM

## 2023-12-15 DIAGNOSIS — R809 Proteinuria, unspecified: Secondary | ICD-10-CM

## 2023-12-18 ENCOUNTER — Ambulatory Visit (INDEPENDENT_AMBULATORY_CARE_PROVIDER_SITE_OTHER): Admitting: Otolaryngology

## 2023-12-22 ENCOUNTER — Other Ambulatory Visit: Payer: Self-pay | Admitting: Family

## 2023-12-22 DIAGNOSIS — E119 Type 2 diabetes mellitus without complications: Secondary | ICD-10-CM

## 2024-04-07 ENCOUNTER — Encounter: Admitting: Family

## 2024-08-10 ENCOUNTER — Encounter: Admitting: Family
